# Patient Record
Sex: Male | Born: 1980 | Hispanic: Yes | Marital: Married | Smoking: Light tobacco smoker
Health system: Southern US, Community
[De-identification: ages and names within clinical notes are randomized; demographics above are authoritative.]

## PROBLEM LIST (undated history)

## (undated) DIAGNOSIS — I10 Essential (primary) hypertension: Secondary | ICD-10-CM

## (undated) DIAGNOSIS — F419 Anxiety disorder, unspecified: Secondary | ICD-10-CM

## (undated) HISTORY — PX: FRACTURE SURGERY: SHX138

---

## 2007-10-14 ENCOUNTER — Emergency Department: Payer: Self-pay | Admitting: Emergency Medicine

## 2007-10-14 ENCOUNTER — Other Ambulatory Visit: Payer: Self-pay

## 2009-07-09 IMAGING — US ABDOMEN ULTRASOUND
1 series · 17 of 25 positions shown · non-contrast
Comparison: none

REASON FOR EXAM: RUQ pain with fatty foods
COMMENTS:

[Series 1: abdomen ultrasound · 17 of 47 slices shown]
[im 1/47]
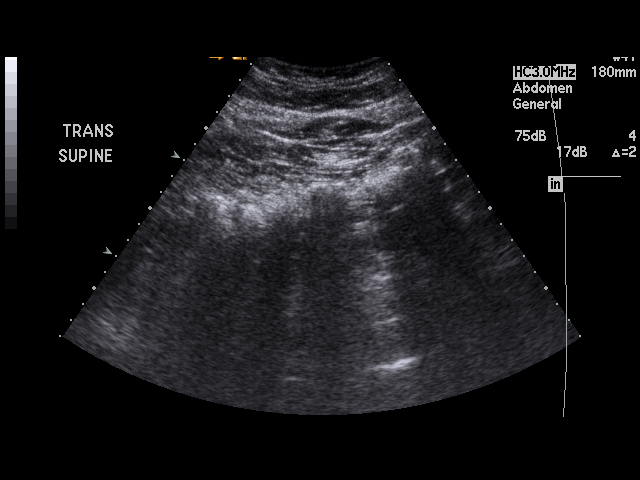
[im 4/47]
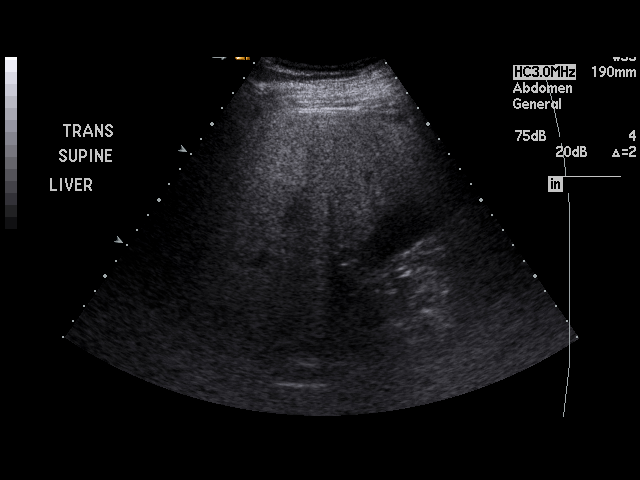
[im 6/47]
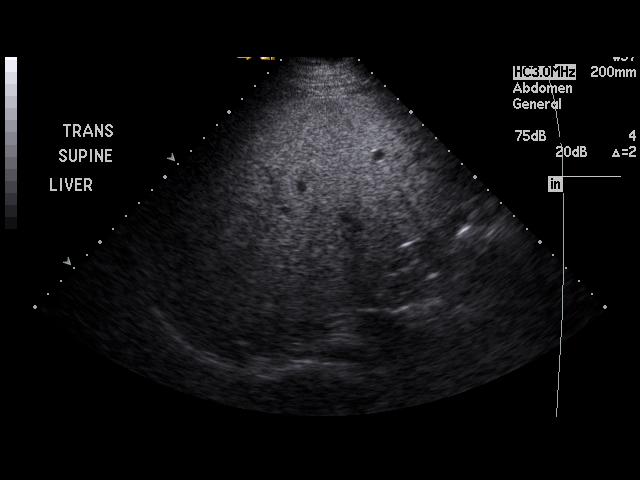
[im 10/47]
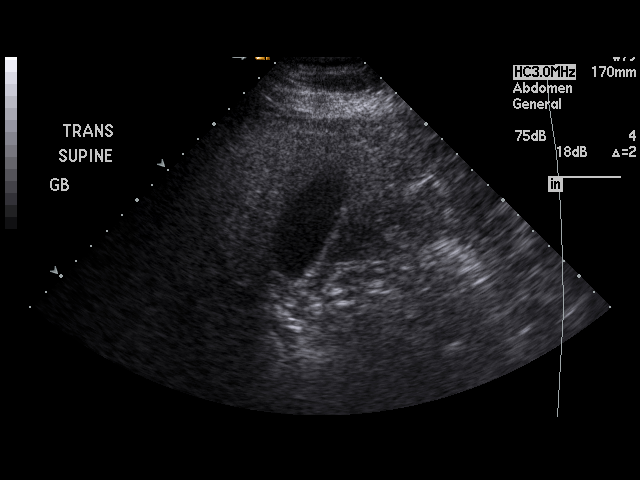
[im 12/47]
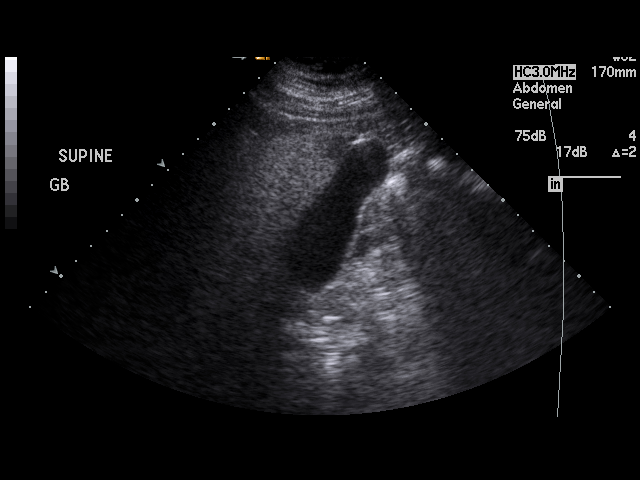
[im 16/47]
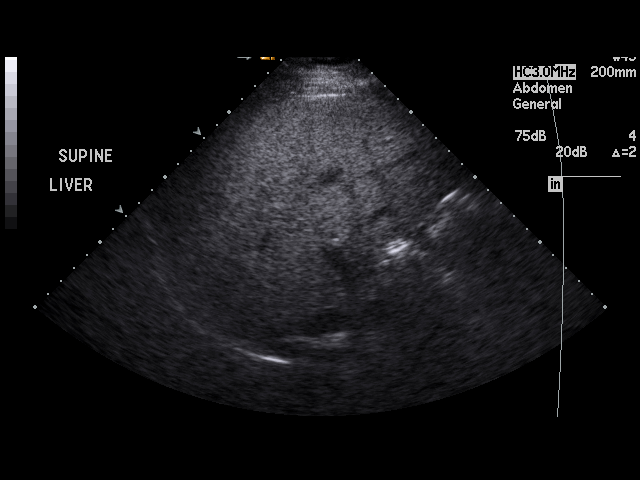
[im 18/47]
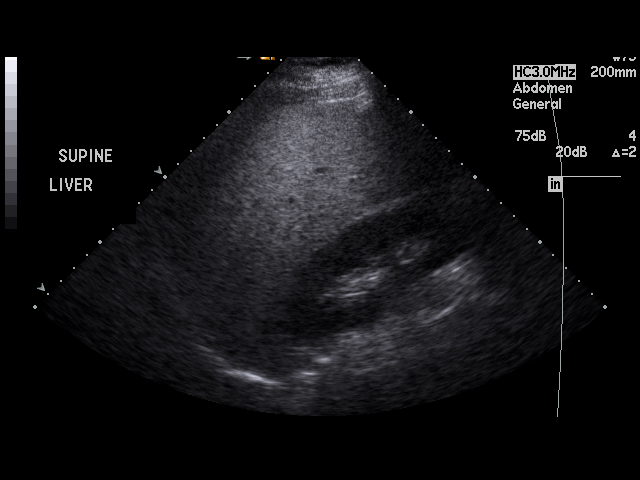
[im 22/47]
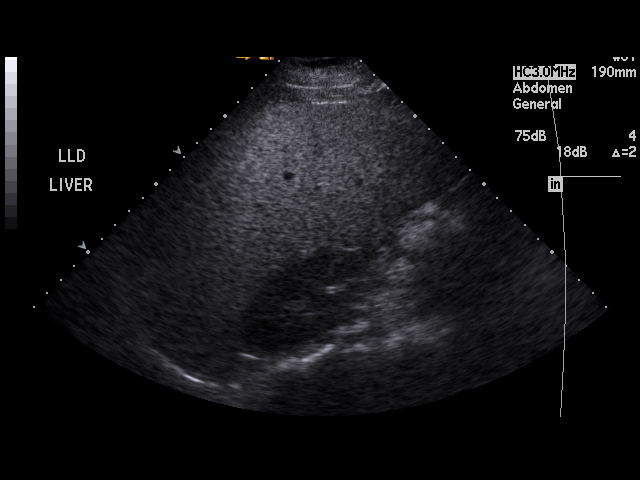
[im 24/47]
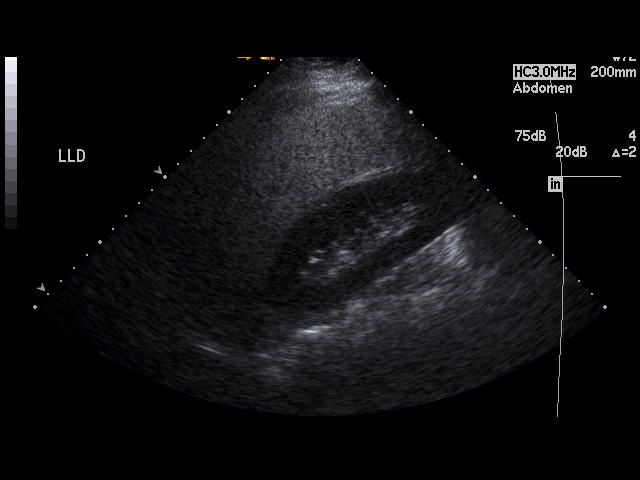
[im 25/47]
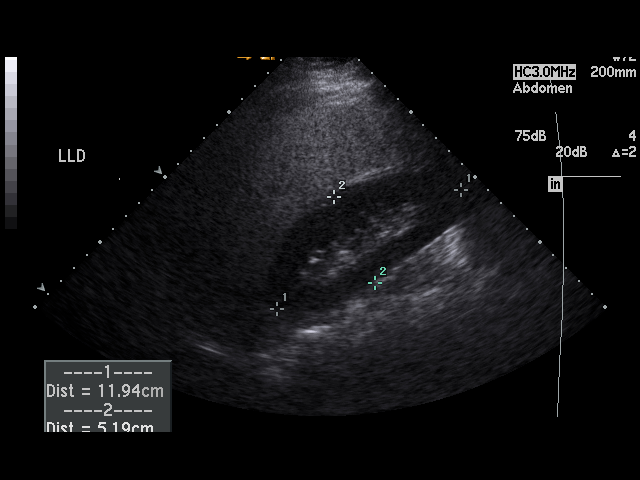
[im 29/47]
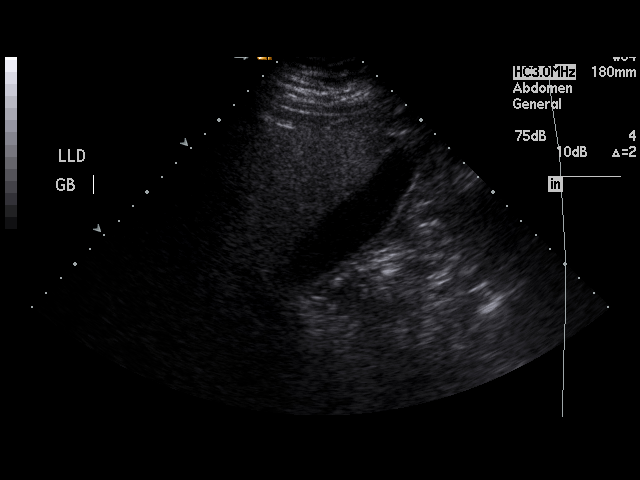
[im 31/47]
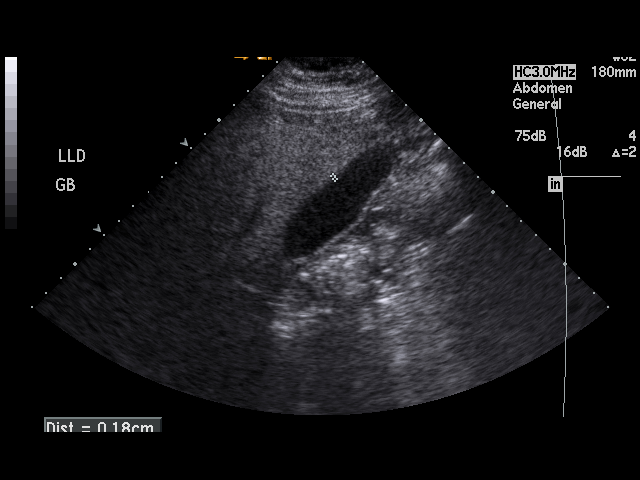
[im 35/47]
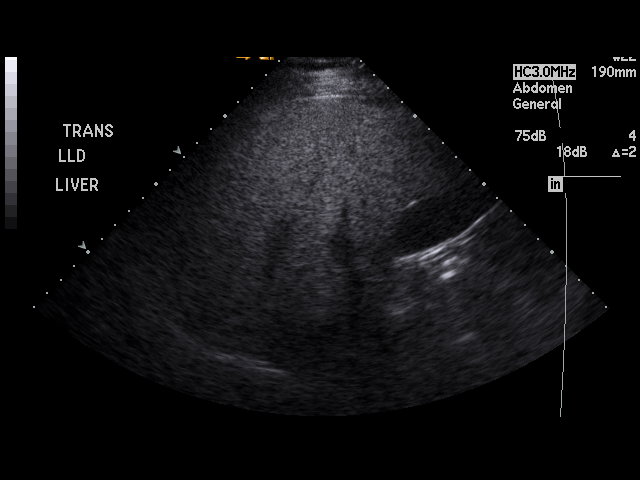
[im 37/47]
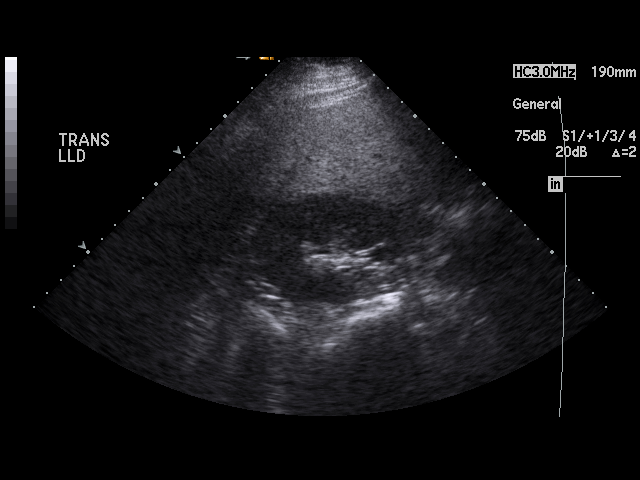
[im 41/47]
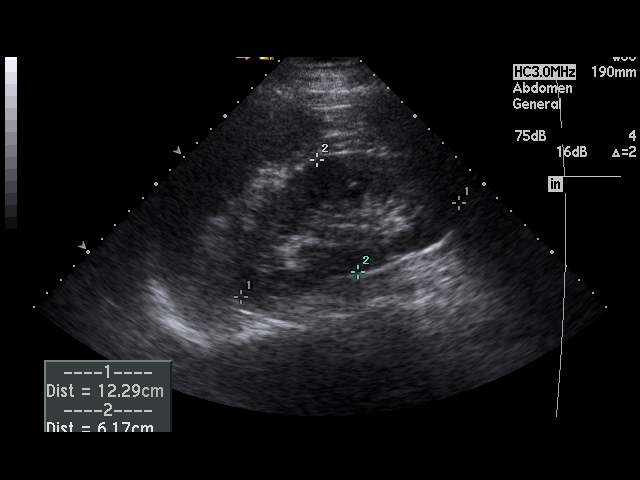
[im 43/47]
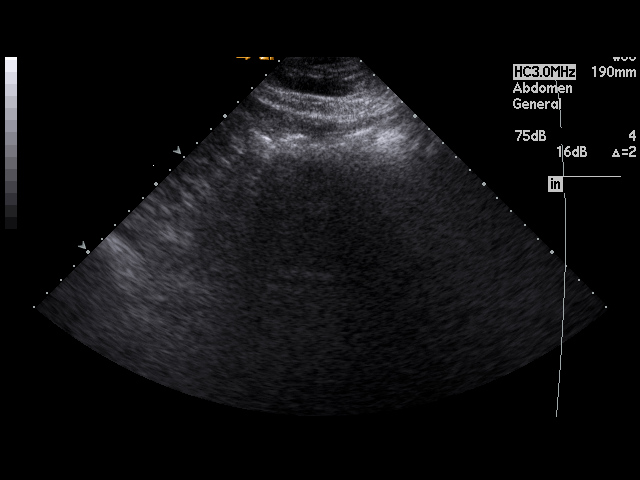
[im 47/47]
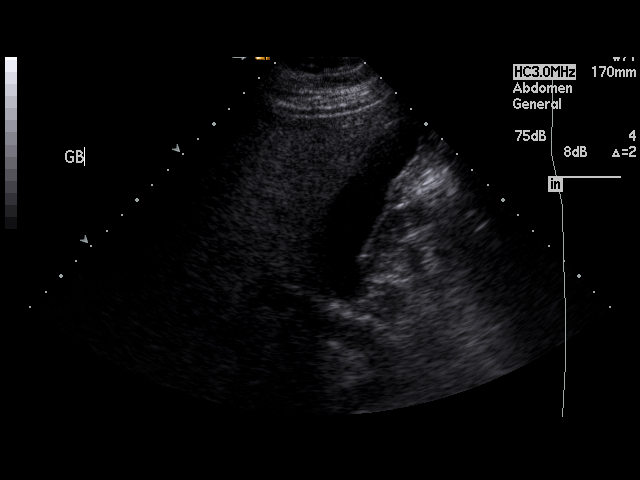

[17 of 25 positions shown; findings below may reference images not displayed]

PROCEDURE:     US  - US ABDOMEN GENERAL SURVEY  - October 14, 2007  [DATE]

RESULT:     The liver demonstrates a heterogeneous echotexture. Hepatopetal
flow is demonstrated within the portal vein. The visualized portions of the
inferior vena cava and aorta are unremarkable. Evaluation of the gallbladder
demonstrates no evidence of pericholecystic fluid, gallstones or sludging.
Gallbladder wall thickness is 1.8 mm. The common bile duct measures 3.3 mm
in diameter. The kidneys and spleen are unremarkable. The pancreas is not
visualized.
IMPRESSION: 1.     Hepatic steatosis without further focal or acute abnormalities.
2.     Dr. Zsizell of the Emergency Department was informed of these
findings via preliminary faxed report on 10/14/2007 at [DATE], [place]
Standard Time.

## 2015-01-23 ENCOUNTER — Emergency Department
Admission: EM | Admit: 2015-01-23 | Discharge: 2015-01-23 | Disposition: A | Discharge status: Home / Self Care | Payer: Self-pay | Attending: Emergency Medicine | Admitting: Emergency Medicine

## 2015-01-23 ENCOUNTER — Encounter: Payer: Self-pay | Admitting: Emergency Medicine

## 2015-01-23 DIAGNOSIS — Z720 Tobacco use: Secondary | ICD-10-CM | POA: Insufficient documentation

## 2015-01-23 DIAGNOSIS — K591 Functional diarrhea: Secondary | ICD-10-CM | POA: Insufficient documentation

## 2015-01-23 MED ORDER — DICYCLOMINE HCL 10 MG PO CAPS
10.0000 mg | ORAL_CAPSULE | Freq: Once | ORAL | Status: AC
  Administered 2015-01-23: 10 mg via ORAL
  Filled 2015-01-23: qty 1

## 2015-01-23 MED ORDER — DICYCLOMINE HCL 20 MG PO TABS: 20.0000 mg | ORAL_TABLET | Freq: Three times a day (TID) | ORAL | Status: DC | PRN

## 2015-01-23 MED ORDER — DIPHENOXYLATE-ATROPINE 2.5-0.025 MG PO TABS: 1.0000 | ORAL_TABLET | Freq: Four times a day (QID) | ORAL | Status: AC | PRN

## 2015-01-23 MED ORDER — DIPHENOXYLATE-ATROPINE 2.5-0.025 MG PO TABS
2.0000 | ORAL_TABLET | Freq: Once | ORAL | Status: AC
  Administered 2015-01-23: 2 via ORAL
  Filled 2015-01-23: qty 2

## 2015-01-23 NOTE — ED Notes (Signed)
Patient here complaining of diarrhea, began Sat. States he has been to the bathroom X 8 with increasing frequency.  Family members have similar symptoms with vomiting and diarrhea.  Wife gave him Immodium AD.

## 2015-01-23 NOTE — Discharge Instructions (Signed)
Opciones de alimentos para ayudar a Actuary - Adultos (Food Choices to Help Relieve Diarrhea, Adult) Cuando se tiene diarrea, los alimentos que se ingieren y los hbitos de alimentacin son Engineer, production. Elegir los [employer] y las bebidas adecuados ayuda a Actuary. Adems, debido a que la diarrea puede durar [employer], debe reponer la prdida de lquidos y Customer service manager (como sodio, potasio y Editor, commissioning) a fin de ayudar a Statistician.  QU PAUTAS GENERALES DEBO SEGUIR?  Beba lentamente 1 taza (8 onzas) de lquido por cada episodio de diarrea. Si bebe una cantidad de lquidos suficiente, la orina ser de tono claro o color amarillo plido.  Consuma alimentos con almidn. Algunas buenas opciones son arroz blanco, tostada blanca, pasta, cereales con bajo contenido de fibras, papas al horno (sin cscara), galletas saladas y panecillos.  Evite las porciones grandes de cualquier vegetal cocido.  Limite las frutas a dos porciones por da. Una porcin es  taza o un trozo pequeo.  Alimentos con menos de 2 g de fibra por porcin.  Limite las grasas a menos de 8 cucharaditas (38g) por Futures trader.  Evite las comidas fritas.  Consuma alimentos que contengan probiticos. Los probiticos se encuentran en ciertos productos lcteos.  Evite los alimentos y las bebidas que pueden aumentar la velocidad a la que el alimento se mueve a travs del estmago y de los intestinos (tracto gastrointestinal). Lo que debe evitar:  Alimentos ricos en fibra, como frutas secas, frutas y vegetales crudos, frutos secos, semillas, alimentos con cereales integrales.  Alimentos muy condimentados y con alto contenido de Neurosurgeon.  Alimentos y bebidas endulzados con jarabe de maz de alto contenido de fructosa, miel o alcoholes de International aid/development worker, como xilitol, sorbitol y manitol. QU ALIMENTOS SE RECOMIENDAN? Cereales Arroz blanco. Pan blanco, francs o pita (fresco o tostado), incluidos los  [place], los bollos y las rosquillas. Pastas blancas. Galletas de [place], [place] o [place]. Pretzels. Cereales con bajo contenido de [employer] cocidos en agua (como harina de maz, smola o crema de cereales). Muffins. Pan cimo Tostada Melba. [place]iscote.  Vegetales Papas (sin cscara). Jugo de tomates o de vegetales Vegetales bien cocidos o enlatados sin semillas. Deatra James tierna. Frutas Pur de [employer] cocido o enlatado, damascos, cerezas, cctel de frutas, pomelos, duraznos, peras o ciruelas. Bananas frescas, manzanas sin cscara, cerezas, uvas, meln, pomelo, duraznos, naranjas o ciruelas.  Carnes y otros productos con protenas Pollo al horno o hervido. Huevos. Tofu. Pescado. Mariscos. Mantequilla de man, sin trozos. Carne molida o un bife tierno bien cocido, jamn, ternera, cordero, cerdo o aves.  Lcteos Yogur natural, kefir y Dentist bebible sin Paediatric nurse. Leche sin Advice worker, suero de [place] o [place] de soja. Queso duro comn. Bebidas Bebidas deportivas. Caldos claros. Jugos de fruta diluidos (excepto de ciruelas). Gaseosas sin cafena comunes, como gaseosa de [place]. Agua. Ts descafeinados. Soluciones de rehidratacin oral. Bebidas sin azcar no endulzadas con alcoholes de azcar. Otros Consom, caldo o sopas hechas con los alimentos recomendados.  Los artculos mencionados arriba pueden no ser [employer]on de las bebidas o los alimentos recomendados. Comunquese con el nutricionista para conocer ms opciones. QU ALIMENTOS NO SE RECOMIENDAN? Cereales Cereales, galletas, pastas, panecillos y panes de cereales integrales, salvado o centeno. Arroz integral o arroz salvaje. Cereales con menos de 2 g de fibra por porcin. Tortillas de maz o tacos. Harina de avena cocida o seca. Granola. Palomitas de maz. Vegetales Vegetales crudos. Repollo, brcoli, repollitos de Brus[place], alcachofas, porotos, hojas de remolacha, maz,  col rizada, legumbres, guisantes y batatas. Cscara de papas.  [place] y repollo cocidos. Nils Pyle Frutas secas, incluidas las ciruelas y los dtiles. Frutas crudas. Compota o ciruelas secas. Manzanas frescas con cscara, damascos, mangos, peras, frambuesas y frutillas.  Carnes y otros productos con protenas Mantequilla de man espesa. Frutos secos y semillas. Porotos y lentejas. Panceta.  Lcteos Quesos con alto contenido de [place]. Leche, leche chocolatada y bebidas hechas con [place], como los batidos. Crema. Helados. Dulces y [employer], donas y pan dulce. Panqueques y waffles. Grasas y [employer] Noble. Salsas a base de crema. Margarina. Aceites para ensaladas. Condimentos para ensaladas. Aceitunas. Aguacates.  Bebidas Bebidas con cafena (como caf, t, refrescos o bebidas energizantes). Bebidas alcohlicas. Jugos de frutas con pulpa. Jugo de ciruelas. Bebidas endulzadas con jarabe de maz de alto contenido de fructosa o alcoholes de International aid/development worker. Otros Coco. Salsa picante. [place] en polvo. [place]. Salsas. Sopas a base de crema o de [place].  Los artculos mencionados arriba pueden no ser [employer]on de las bebidas y los alimentos que se Theatre stage manager. Comunquese con el nutricionista para recibir ms informacin. QU DEBO HACER SI ME DESHIDRATO? Algunas veces, la diarrea puede producir deshidratacin. Entre los signos de deshidratacin se incluyen la orina oscura y la boca y la piel secas. Si piensa que est deshidratado, debe rehidratarse con una solucin de rehidratacin oral. Estas soluciones se pueden comprar en las farmacias, en las tiendas minoristas o por Internet.  Beba  o 1 taza (120-25ml) de solucin de rehidratacin oral cada vez que tenga un episodio de diarrea. Si beber esta cantidad empeora la diarrea, intente beber en cantidades ms pequeas con ms frecuencia. Por ejemplo, tomar 1-3 cucharaditas (5-53ml) cada 5-44minutos.  Una regla general para mantenerse hidratado es beber 1  -2 litros de lquido Air cabin crew. Hable  con el mdico sobre la cantidad especfica que usted debe beber diariamente. Beba suficiente lquido para Photographer orina clara o de color amarillo plido.   Esta informacin no tiene Theme park manager el consejo del mdico. Asegrese de hacerle al mdico cualquier pregunta que tenga.   Document Released: 04/01/2005 Document Revised: 04/22/2014 Elsevier Interactive Patient Education 2016 [employer].  Diarrea  (Diarrhea) La diarrea es la materia fecal (heces) acuosas. Puede hacerlo sentir dbil, cansado, sediento, o con la boca seca (signos de deshidratacin). La materia fecal acuosa es signo de otro problema, generalmente una infeccin. Suele durar 2 o 3 das. Puede durar ms tiempo si es el signo de una enfermedad grave. Cudese segn lo que le indique su mdico.  CUIDADOS EN EL HOGAR   Beba 1 taza (8 onzas) de lquido cada vez que tenga una deposicin acuosa.  No beba los siguientes lquidos:  Los que contengan azcares simples (fructosa, glucosa, galactosa, [place], sacarosa, maltosa).  Bebidas deportivas.  Jugos de fruta.  Productos lcteos enteros.  Gaseosas.  Bebidas con cafena (caf, t, gaseosas) o alcohol.  Puede usar soluciones de rehidratacin oral si su mdico lo autoriza. Usted mismo puede preparar la solucin. Siga esta receta:   -  cucharadita de sal.   cucharadita de bicarbonato de soda.   de cucharadita de sal sustituta que contenga cloruro de potasio.  1  cucharada de azcar.  1 litros (34 oz) de agua.  Evite los siguientes alimentos:  Los que tienen gran contenido de [place], como frutas y verduras.  Frutas secas, semillas y panes y cereales integrales.   Las endulzadas con alcohol de azcar (xylitol, sorbitol, manitol).  Trate de  comer los siguientes alimentos:  Los que tienen almidn, como arroz, pan, pasta, cereales bajos en azcar, avena, smola de maz, papas al horno, galletas y panecillos.  Bananas.  Pur de [employer].  Consuma  alimentos ricos en probiticos, como yogur y productos lcteos fermentados.  Lvese bien las manos cada vez que tenga una deposicin acuosa.  Slo tome los medicamentos que le haya indicado su mdico.  Tome un bao caliente para ayudar a Engineer, manufacturing systems ardor o dolor que producen las deposiciones aguadas. SOLICITE AYUDA DE INMEDIATO SI:   No puede beber lquidos sin devolver vomitar.  Sigue vomitando.  Tiene sangre en la materia fecal, o es de color negro y de aspecto alquitranado.  No hay emisin de [place] durante 6 a 8 horas o elimina una pequea cantidad de [place].  Usted tiene dolor de estmago (abdominal) que empeora o se mantiene en el mismo lugar (localiza).  Se siente dbil, mareado, confundido o se desmaya.  Siente un dolor de cabeza muy intenso.  La materia fecal acuosa no mejora.  Tiene fiebre o sntomas que persisten durante ms de 2-3 das.  Tiene fiebre y los sntomas empeoran de manera sbita. ASEGRESE DE QUE:   Comprende estas instrucciones.  Controlar su enfermedad.  Solicitar ayuda de inmediato si no mejora o si empeora.   Esta informacin no tiene Theme park manager el consejo del mdico. Asegrese de hacerle al mdico cualquier pregunta que tenga.   Document Released: 03/18/2012 Document Revised: 04/22/2014 Elsevier Interactive Patient Education [employer]

## 2015-01-23 NOTE — ED Provider Notes (Signed)
[facility] Emergency Department Provider Note  ____________________________________________  Time seen: Approximately 8:41 AM  I have reviewed the triage vital signs and the nursing notes.   HISTORY  Chief Complaint Diarrhea    HPI Frank Obrien is a 34 y.o. male patient complaining of diarrhea for 3 days. Patient has been to bathroom 8/10 in the last 24 hours. Patient states stools are watery. Patient family member has similar symptoms except they have been vomiting he has experienced vomiting. Patient said it was a transient relief with Imodium. Patient denies any bloody stools. Patient is no pain but stomach cramping.    History reviewed. No pertinent past medical history.  There are no active problems to display for this patient.   Past Surgical History  Procedure Laterality Date  . Fracture surgery      Current Outpatient Rx  Name  Route  Sig  Dispense  Refill  . dicyclomine (BENTYL) 20 MG tablet   Oral   Take 1 tablet (20 mg total) by mouth 3 (three) times daily as needed for spasms.   30 tablet   0   . diphenoxylate-atropine (LOMOTIL) 2.5-0.025 MG tablet   Oral   Take 1 tablet by mouth 4 (four) times daily as needed for diarrhea or loose stools.   30 tablet   1     Allergies Review of patient's allergies indicates no known allergies.  No family history on file.  Social History Social History  Substance Use Topics  . Smoking status: Light Tobacco Smoker  . Smokeless tobacco: None  . Alcohol Use: Yes     Comment: 1-2 a week    Review of Systems Constitutional: No fever/chills Eyes: No visual changes. ENT: No sore throat. Cardiovascular: Denies chest pain. Respiratory: Denies shortness of breath. Gastrointestinal: No abdominal pain.  No nausea, no vomiting. Diarrhea.  Genitourinary: Negative for dysuria. Musculoskeletal: Negative for back pain. Skin: Negative for rash. Neurological: Negative for headaches,  focal weakness or numbness. 10-point ROS otherwise negative.  ____________________________________________   PHYSICAL EXAM:  VITAL SIGNS: ED Triage Vitals  Enc Vitals Group     BP 01/23/15 0757 134/84 mmHg     Pulse Rate 01/23/15 0757 89     Resp --      Temp 01/23/15 0757 97.6 F (36.4 C)     Temp Source 01/23/15 0757 Oral     SpO2 01/23/15 0757 100 %     Weight 01/23/15 0757 215 lb (97.523 kg)     Height 01/23/15 0757  (1.727 m)     Head Cir --      Peak Flow --      Pain Score --      Pain Loc --      Pain Edu? --      Excl. in GC? --     Constitutional: Alert and oriented. Well appearing and in no acute distress. Eyes: Conjunctivae are normal. PERRL. EOMI. Head: Atraumatic. Nose: No congestion/rhinnorhea. Mouth/Throat: Mucous membranes are moist.  Oropharynx non-erythematous. Neck: No stridor.  No cervical spine tenderness to palpation. Hematological/Lymphatic/Immunilogical: No cervical lymphadenopathy. Cardiovascular: Normal rate, regular rhythm. Grossly normal heart sounds.  Good peripheral circulation. Respiratory: Normal respiratory effort.  No retractions. Lungs CTAB. Gastrointestinal: Soft and nontender. No distention. No abdominal bruits. Hyperactive bowel sounds. Musculoskeletal: No lower extremity tenderness nor edema.  No joint effusions. Neurologic:  Normal speech and language. No gross focal neurologic deficits are appreciated. No gait instability. Skin:  Skin is warm, dry  and intact. No rash noted. Psychiatric: Mood and affect are normal. Speech and behavior are normal.  ____________________________________________   LABS (all labs ordered are listed, but only abnormal results are displayed)  Labs Reviewed - No data to display ____________________________________________  EKG   ____________________________________________  RADIOLOGY   ____________________________________________   PROCEDURES  Procedure(s) performed:  None  Critical Care performed: No  ____________________________________________   INITIAL IMPRESSION / ASSESSMENT AND PLAN / ED COURSE  Pertinent labs & imaging results that were available during my care of the patient were reviewed by me and considered in my medical decision making (see chart for details).  Diarrhea. Patient given prescription for Lomotil and Bentyl. Patient advised follow-up with GI clinic if condition persists. ____________________________________________   FINAL CLINICAL IMPRESSION(S) / ED DIAGNOSES  Final diagnoses:  Functional diarrhea      Joni Reining, PA-C 01/23/15 1610  Myrna Blazer, MD 01/23/15 1556

## 2016-10-19 ENCOUNTER — Emergency Department: Payer: Self-pay

## 2016-10-19 ENCOUNTER — Emergency Department
Admission: EM | Admit: 2016-10-19 | Discharge: 2016-10-19 | Disposition: A | Discharge status: Home / Self Care | Payer: Self-pay | Attending: Emergency Medicine | Admitting: Emergency Medicine

## 2016-10-19 DIAGNOSIS — I10 Essential (primary) hypertension: Secondary | ICD-10-CM | POA: Insufficient documentation

## 2016-10-19 DIAGNOSIS — R002 Palpitations: Secondary | ICD-10-CM | POA: Insufficient documentation

## 2016-10-19 DIAGNOSIS — F17200 Nicotine dependence, unspecified, uncomplicated: Secondary | ICD-10-CM | POA: Insufficient documentation

## 2016-10-19 HISTORY — DX: Essential (primary) hypertension: I10

## 2016-10-19 LAB — BASIC METABOLIC PANEL
Anion gap: 10 (ref 5–15)
BUN: 13 mg/dL (ref 6–20)
CHLORIDE: 106 mmol/L (ref 101–111)
CO2: 23 mmol/L (ref 22–32)
CREATININE: 0.58 mg/dL — AB (ref 0.61–1.24)
Calcium: 9.5 mg/dL (ref 8.9–10.3)
GFR calc Af Amer: >60 mL/min (ref >60)
GFR calc non Af Amer: >60 mL/min (ref >60)
GLUCOSE: 114 mg/dL — AB (ref 65–99)
POTASSIUM: 3.1 mmol/L — AB (ref 3.5–5.1)
SODIUM: 139 mmol/L (ref 135–145)

## 2016-10-19 LAB — CBC
HEMATOCRIT: 47.5 % (ref 40.0–52.0)
Hemoglobin: 16.4 g/dL (ref 13.0–18.0)
MCH: 31.7 pg (ref 26.0–34.0)
MCHC: 34.4 g/dL (ref 32.0–36.0)
MCV: 92.1 fL (ref 80.0–100.0)
PLATELETS: 271 10*3/uL (ref 150–440)
RBC: 5.16 MIL/uL (ref 4.40–5.90)
RDW: 13.6 % (ref 11.5–14.5)
WBC: 10.8 10*3/uL — ABNORMAL HIGH (ref 3.8–10.6)

## 2016-10-19 LAB — TROPONIN I: Troponin I: <0.03 ng/mL (ref <0.03)

## 2016-10-19 MED ORDER — LORAZEPAM 0.5 MG PO TABS
0.5000 mg | ORAL_TABLET | Freq: Once | ORAL | Status: AC
  Administered 2016-10-19: 0.5 mg via ORAL
  Filled 2016-10-19: qty 1

## 2016-10-19 NOTE — ED Notes (Signed)
EKG showed and signed off by [facility].

## 2016-10-19 NOTE — ED Provider Notes (Signed)
[facility] Emergency Department Provider Note  Time seen: 9:43 PM  I have reviewed the triage vital signs and the nursing notes.   HISTORY  Chief Complaint Shortness of Breath and Anxiety    HPI Frank MeuseLeobardo Tawni Obrien Rubio is a 36 y.o. male with a past medical history of hypertension who presents to the emergency department with palpitations. According to the patient he was having some back pain earlier so he took over-the-counter back pain medication which contains acetaminophen and 65 mg of caffeine per tablet. He states he take 2 tablets earlier and then 6 hours later this evening took 2 additional tablets. He states worsening hour after taking the second dose of medication he was feeling very anxious and jittery. He was having palpitations in which his heart felt like it was racing. Denies any chest pain or trouble breathing at any point. Patient states several years ago he took caffeine pills and had a similar episode. He was unaware that this medication contains caffeine. Patient states he rarely drinks caffeine. Patient states he is feeling somewhat better but continues to feel very anxious and jittery. Heart rate currently 105 bpm during my examination.  Past Medical History:  Diagnosis Date  . Hypertension     There are no active problems to display for this patient.   Past Surgical History:  Procedure Laterality Date  . FRACTURE SURGERY      Prior to Admission medications   Medication Sig Start Date End Date Taking? Authorizing Provider  dicyclomine (BENTYL) 20 MG tablet Take 1 tablet (20 mg total) by mouth 3 (three) times daily as needed for spasms. 01/23/15 01/23/16  Joni ReiningSmith, Ronald K, PA-C    No Known Allergies  History reviewed. No pertinent family history.  Social History Social History  Substance Use Topics  . Smoking status: Light Tobacco Smoker  . Smokeless tobacco: Never Used  . Alcohol use Yes     Comment: 1-2 a week    Review of  Systems Constitutional: Negative for fever. Cardiovascular: Negative for chest pain.Positive for palpitations. Respiratory: Negative for shortness of breath. Gastrointestinal: Negative for abdominal pain Neurological: Negative for headache All other ROS negative  ____________________________________________   PHYSICAL EXAM:  VITAL SIGNS: ED Triage Vitals  Enc Vitals Group     BP 10/19/16 2008 (!) 188/101     Pulse Rate 10/19/16 2008 (!) 116     Resp 10/19/16 2008 14     Temp 10/19/16 2008 99.5 F (37.5 C)     Temp Source 10/19/16 2008 Oral     SpO2 10/19/16 2008 98 %     Weight 10/19/16 2016 230 lb (104.3 kg)     Height 10/19/16 2016 5\' 8"  (1.727 m)     Head Circumference --      Peak Flow --      Pain Score --      Pain Loc --      Pain Edu? --      Excl. in GC? --     Constitutional: Alert and oriented. Well appearing and in no distress. Eyes: Normal exam ENT   Head: Normocephalic and atraumatic.   Mouth/Throat: Mucous membranes are moist. Cardiovascular: Normal rate, regular rhythm. No murmur Respiratory: Normal respiratory effort without tachypnea nor retractions. Breath sounds are clear Gastrointestinal: Soft and nontender. No distention.  Musculoskeletal: Nontender with normal range of motion in all extremities. No lower extremity tenderness or edema. Neurologic:  Normal speech and language. No gross focal neurologic deficits  Skin:  Skin is warm, dry and intact.  Psychiatric: Mood and affect are normal.   ____________________________________________    EKG  EKG reviewed and interpreted by myself shows sinus tachycardia at 109 bpm, narrow QRS, normal axis, normal intervals, not concerning ST changes.  ____________________________________________    RADIOLOGY  Chest x-ray negative  ____________________________________________   INITIAL IMPRESSION / ASSESSMENT AND PLAN / ED COURSE  Pertinent labs & imaging results that were available during  my care of the patient were reviewed by me and considered in my medical decision making (see chart for details).  The patient presents to the emergency department for palpitations. Patient took 65 mg of caffeine per tablet to 2 tablets twice equally 260 mg of caffeine today. This is a significant amount of stimulant. I discussed with the patient is likely the cause of his palpitations. The rest of his workup including enzymes are normal. EKG is reassuring, mild tachycardia but this is likely due to to caffeine use. Given the patient's anxiousness in the emergency department we will dose 0.5 mg of Ativan. I discussed drinking plenty of non-caffeinated fluids and obtaining plenty of rest. Patient agreeable.  ____________________________________________   FINAL CLINICAL IMPRESSION(S) / ED DIAGNOSES  palpitations    Minna Antis, MD 10/19/16 2147

## 2016-10-19 NOTE — ED Triage Notes (Signed)
Pt presents via POV s/o SOB with ambulation and palpitations. Reports taking OTC medication for chronic back pain containing caffeine, now c/o palpitations and some SOB with ambulation. Denies chest pain.

## 2017-01-16 ENCOUNTER — Encounter: Payer: Self-pay | Admitting: Emergency Medicine

## 2017-01-16 DIAGNOSIS — I10 Essential (primary) hypertension: Secondary | ICD-10-CM | POA: Insufficient documentation

## 2017-01-16 DIAGNOSIS — F17210 Nicotine dependence, cigarettes, uncomplicated: Secondary | ICD-10-CM | POA: Insufficient documentation

## 2017-01-16 DIAGNOSIS — L0889 Other specified local infections of the skin and subcutaneous tissue: Secondary | ICD-10-CM | POA: Insufficient documentation

## 2017-01-16 NOTE — ED Triage Notes (Signed)
Pt c/o right pinky finger pain x1 week. Per pt, he had blister to the finger last Friday and since the area has developed swelling, pain and hot to the touch. Pt denies drainage.

## 2017-01-17 ENCOUNTER — Emergency Department
Admission: EM | Admit: 2017-01-17 | Discharge: 2017-01-17 | Disposition: A | Discharge status: Home / Self Care | Payer: Self-pay | Attending: Emergency Medicine | Admitting: Emergency Medicine

## 2017-01-17 DIAGNOSIS — L089 Local infection of the skin and subcutaneous tissue, unspecified: Secondary | ICD-10-CM

## 2017-01-17 MED ORDER — CEPHALEXIN 500 MG PO CAPS: 500.0000 mg | ORAL_CAPSULE | Freq: Three times a day (TID) | ORAL | 0 refills | Status: DC

## 2017-01-17 MED ORDER — SULFAMETHOXAZOLE-TRIMETHOPRIM 800-160 MG PO TABS: 2.0000 | ORAL_TABLET | Freq: Two times a day (BID) | ORAL | 0 refills | Status: DC

## 2017-01-17 MED ORDER — CEPHALEXIN 500 MG PO CAPS
500.0000 mg | ORAL_CAPSULE | Freq: Once | ORAL | Status: AC
  Administered 2017-01-17: 500 mg via ORAL
  Filled 2017-01-17: qty 1

## 2017-01-17 MED ORDER — SULFAMETHOXAZOLE-TRIMETHOPRIM 800-160 MG PO TABS
2.0000 | ORAL_TABLET | Freq: Once | ORAL | Status: AC
  Administered 2017-01-17: 2 via ORAL
  Filled 2017-01-17: qty 2

## 2017-01-17 MED ORDER — TETANUS-DIPHTH-ACELL PERTUSSIS 5-2.5-18.5 LF-MCG/0.5 IM SUSP
0.5000 mL | Freq: Once | INTRAMUSCULAR | Status: AC
  Administered 2017-01-17: 0.5 mL via INTRAMUSCULAR
  Filled 2017-01-17: qty 0.5

## 2017-01-17 NOTE — ED Provider Notes (Signed)
[facility] Emergency Department Provider Note  ____________________________________________   First MD Initiated Contact with Patient 01/17/17 0222     (approximate)  I have reviewed the triage vital signs and the nursing notes.   HISTORY  Chief Complaint Hand Pain    HPI Frank Obrien is a 36 y.o. male with no contributory medical history who presents for evaluation of pain and swelling in his right little finger.  He reports that originally he injured it about a week ago at work because of repetitively having to pull heavy objects with a rope.  It resulted in blisters to the proximal phalanx, palmar side, of both little fingers.  The left has scabbed over calloused over, but the right one became painful and increasingly swollen and starting about 24 hours ago.  He says that it hurts to flex and extend the finger and there is some redness and swelling to the part of the finger closest to the hand.  It does not radiate up into his hand and he has no pain in the palm or the back of his hand.  It does not radiate down past the first knuckle of his little finger.  He is still able to flex and extend even though there is some pain.  There is no drainage from the central area that had the blister originally.  He denies fever/chills, nausea, vomiting, numbness, tingling.   Past Medical History:  Diagnosis Date  . Hypertension     There are no active problems to display for this patient.   Past Surgical History:  Procedure Laterality Date  . FRACTURE SURGERY      Prior to Admission medications   Medication Sig Start Date End Date Taking? Authorizing Provider  cephALEXin (KEFLEX) 500 MG capsule Take 1 capsule (500 mg total) by mouth 3 (three) times daily. 01/17/17   Loleta Rose, MD  dicyclomine (BENTYL) 20 MG tablet Take 1 tablet (20 mg total) by mouth 3 (three) times daily as needed for spasms. 01/23/15 01/23/16  Joni Reining, PA-C    sulfamethoxazole-trimethoprim (BACTRIM DS,SEPTRA DS) 800-160 MG tablet Take 2 tablets by mouth 2 (two) times daily. 01/17/17   Loleta Rose, MD    Allergies Patient has no known allergies.  History reviewed. No pertinent family history.  Social History Social History  Substance Use Topics  . Smoking status: Light Tobacco Smoker  . Smokeless tobacco: Never Used  . Alcohol use Yes     Comment: 1-2 a week    Review of Systems Constitutional: No fever/chills Respiratory: Denies shortness of breath. Gastrointestinal: No abdominal pain.  No nausea, no vomiting.  No diarrhea.  No constipation. Musculoskeletal: Pain and swelling in proximal right little finger after injury last week. Negative for neck pain.  Negative for back pain. Integumentary: Negative for rash.  Healing blister on bilateral proximal phalanx of both little fingers. Neurological: Negative for focal weakness or numbness.   ____________________________________________   PHYSICAL EXAM:  VITAL SIGNS: ED Triage Vitals  Enc Vitals Group     BP 01/16/17 2321 (!) 165/91     Pulse Rate 01/16/17 2321 95     Resp 01/16/17 2321 16     Temp 01/16/17 2321 97.8 F (36.6 C)     Temp Source 01/16/17 2321 Oral     SpO2 01/16/17 2321 100 %     Weight 01/16/17 2319 104.3 kg (230 lb)     Height --      Head Circumference --  Peak Flow --      Pain Score --      Pain Loc --      Pain Edu? --      Excl. in GC? --     Constitutional: Alert and oriented. Well appearing and in no acute distress. Cardiovascular: Normal rate, regular rhythm. Good peripheral circulation.  Respiratory: Normal respiratory effort.  No retractions.  Musculoskeletal: the patient has swelling that is non-circumferential but notable on both sides and the palm of his right little finger in the proximal phalanx.  It does not radiate distally to the other phalanges and does not radiate up into the hand.  He has no pain, tenderness, nor swelling  proximal to the fifth MCP.  he does have some fusiform swelling and holds the finger in slight flexion, but he holds all of his fingers in slight flexion and has no tenderness along the flexor tendon sheath and no pain with passive extension of the affected digit.  There some erythema on the sides and the finger is tense, but there is no fluctuance or induration and no obvious fluid collection amenable to incision and drainage. Neurologic:  Normal speech and language. No gross focal neurologic deficits are appreciated.  Psychiatric: Mood and affect are normal. Speech and behavior are normal.  ____________________________________________   LABS (all labs ordered are listed, but only abnormal results are displayed)  Labs Reviewed - No data to display ____________________________________________  EKG  None - EKG not ordered by ED physician ____________________________________________  RADIOLOGY   No results found.  ____________________________________________   PROCEDURES  Critical Care performed: No   Procedure(s) performed:   Procedures   ____________________________________________   INITIAL IMPRESSION / ASSESSMENT AND PLAN / ED COURSE   my differential diagnosis must include deep infection such as flexor tenosynovitis, but his presentation is not clinically consistent with tenosynovitis as he does not have any pain along the tendon sheath and no pain with passive extension of the digit.  I believe he has a focal cellulitis of the proximal phalanx of the fifth finger due to having an open wound that most likely was contaminated with skin flora.  There is no fluid collection amenable to drainage and I think that I&D would only further introduce more bacteria.  I will start him on both Keflex and Bactrim DS for broad coverage of staph and strep species including MRSA.  I have given him a Tdap because he does not know when he last had a tetanus vaccination. I strongly encouraged  him and his wife to follow-up with the hand specialist at the orthopedics clinic and I gave follow-up information.  I gave my usual and customary return precautions.  They understand and agree with the plan.     ____________________________________________  FINAL CLINICAL IMPRESSION(S) / ED DIAGNOSES  Final diagnoses:  Finger infection     MEDICATIONS GIVEN DURING THIS VISIT:  Medications  sulfamethoxazole-trimethoprim (BACTRIM DS,SEPTRA DS) 800-160 MG per tablet 2 tablet (2 tablets Oral Given 01/17/17 0245)  cephALEXin (KEFLEX) capsule 500 mg (500 mg Oral Given 01/17/17 0245)  Tdap (BOOSTRIX) injection 0.5 mL (0.5 mLs Intramuscular Given 01/17/17 0245)     NEW OUTPATIENT MEDICATIONS STARTED DURING THIS VISIT:  New Prescriptions   CEPHALEXIN (KEFLEX) 500 MG CAPSULE    Take 1 capsule (500 mg total) by mouth 3 (three) times daily.   SULFAMETHOXAZOLE-TRIMETHOPRIM (BACTRIM DS,SEPTRA DS) 800-160 MG TABLET    Take 2 tablets by mouth 2 (two) times daily.    Modified  Medications   No medications on file    Discontinued Medications   No medications on file     Note:  This document was prepared using Dragon voice recognition software and may include unintentional dictation errors.    Loleta Rose, MD 01/17/17 [phone]

## 2017-01-17 NOTE — Discharge Instructions (Signed)
we believe that you have an infection in the tissue of your right little finger.  Please take the full course of both antibiotics prescribed.  Both of them should be available on the $4 list at [facility].  we recommend that you call the number for the hand specialist listed so that you can schedule a follow-up appointment at the next available opportunity.    Return to the emergency department if you develop new or worsening symptoms that concern you.

## 2017-01-19 ENCOUNTER — Encounter: Payer: Self-pay | Admitting: Emergency Medicine

## 2017-01-19 ENCOUNTER — Emergency Department
Admission: EM | Admit: 2017-01-19 | Discharge: 2017-01-19 | Disposition: A | Discharge status: Home / Self Care | Payer: Self-pay | Attending: Emergency Medicine | Admitting: Emergency Medicine

## 2017-01-19 DIAGNOSIS — I10 Essential (primary) hypertension: Secondary | ICD-10-CM | POA: Insufficient documentation

## 2017-01-19 DIAGNOSIS — F17200 Nicotine dependence, unspecified, uncomplicated: Secondary | ICD-10-CM | POA: Insufficient documentation

## 2017-01-19 DIAGNOSIS — L089 Local infection of the skin and subcutaneous tissue, unspecified: Secondary | ICD-10-CM | POA: Insufficient documentation

## 2017-01-19 MED ORDER — TRAMADOL HCL 50 MG PO TABS: 50.0000 mg | ORAL_TABLET | Freq: Four times a day (QID) | ORAL | 0 refills | Status: DC | PRN

## 2017-01-19 NOTE — ED Triage Notes (Signed)
Pt comes into the ED via POV c/o infection of the right pinky.  Patient was seen here Thursday for the same issue and prescribed two antibiotics and naproxen.  Patient states that the pain has not gotten any better.  He has not completed the antibiotics and has not followed up with the hand specialist as recommended per MD.  Patient explains that pain is worse at night and he has no more pain medication.  Patient in NAD at this time.  Swelling and redness present in the right pinky, no drainage present at this time.

## 2017-01-19 NOTE — Discharge Instructions (Signed)
Please continue taking the antibiotics. Call Dr. Bryson Ha office tomorrow.

## 2017-01-30 NOTE — ED Provider Notes (Signed)
[facility] Emergency Department Provider Note   ____________________________________________    I have reviewed the triage vital signs and the nursing notes.   HISTORY  Chief Complaint Cellulitis     HPI Frank Obrien is a 36 y.o. male Who presents withpain and swelling in his right little finger. Patient seen recently in the emergency department and started on antibiotics, he reports he has been compliant with his medication. He has not seen hand surgery as recommend that however. He notes worsening swelling of the proximal portion of his finger. No fevers or chills. He notes that today he has been able to make it drain   Past Medical History:  Diagnosis Date  . Hypertension     There are no active problems to display for this patient.   Past Surgical History:  Procedure Laterality Date  . FRACTURE SURGERY      Prior to Admission medications   Medication Sig Start Date End Date Taking? Authorizing Provider  cephALEXin (KEFLEX) 500 MG capsule Take 1 capsule (500 mg total) by mouth 3 (three) times daily. 01/17/17   Loleta Rose, MD  dicyclomine (BENTYL) 20 MG tablet Take 1 tablet (20 mg total) by mouth 3 (three) times daily as needed for spasms. 01/23/15 01/23/16  Joni Reining, PA-C  sulfamethoxazole-trimethoprim (BACTRIM DS,SEPTRA DS) 800-160 MG tablet Take 2 tablets by mouth 2 (two) times daily. 01/17/17   Loleta Rose, MD  traMADol (ULTRAM) 50 MG tablet Take 1 tablet (50 mg total) by mouth every 6 (six) hours as needed. 01/19/17 01/19/18  Jene Every, MD     Allergies Patient has no known allergies.  No family history on file.  Social History Social History  Substance Use Topics  . Smoking status: Light Tobacco Smoker  . Smokeless tobacco: Never Used  . Alcohol use Yes     Comment: 1-2 a week    Review of Systems  Constitutional: No fever/chills     Gastrointestinal: No abdominal pain.  No nausea, no  vomiting.     Skin: as above     ____________________________________________   PHYSICAL EXAM:  VITAL SIGNS: ED Triage Vitals  Enc Vitals Group     BP 01/19/17 1751 (!) 161/90     Pulse Rate 01/19/17 1751 79     Resp 01/19/17 1751 18     Temp 01/19/17 1751 98.3 F (36.8 C)     Temp Source 01/19/17 1751 Oral     SpO2 01/19/17 1751 98 %     Weight 01/19/17 1749 104.3 kg (230 lb)     Height 01/19/17 1749 1.778 m (5\' 10" )     Head Circumference --      Peak Flow --      Pain Score 01/19/17 1748 1     Pain Loc --      Pain Edu? --      Excl. in GC? --     Constitutional: Alert and oriented. No acute distress. Pleasant and interactive Eyes: Conjunctivae are normal.   Nose: No congestion/rhinnorhea. Mouth/Throat: Mucous membranes are moist.   Cardiovascular: Normal rate, regular rhythm.  Respiratory: Normal respiratory effort.  No retractions.  Musculoskeletal: right little finger: Mild amount of swelling on the dorsal aspect proximally, suspect there was a small abscess there but the patient has pressed on it and purulent drainage has occurred, I am able to express a small amount of purulence as well. Distally the finger is normal, no sausage digits, patient is able  to extend without pain, not consistent with tenosynovitis Neurologic:  Normal speech and language. No gross focal neurologic deficits are appreciated.   Skin:  Skin is warm, dry and intact. No rash noted.   ____________________________________________   LABS (all labs ordered are listed, but only abnormal results are displayed)  Labs Reviewed - No data to display ____________________________________________  EKG   ____________________________________________  RADIOLOGY  None ____________________________________________   PROCEDURES  Procedure(s) performed: No    Critical Care performed: No ____________________________________________   INITIAL IMPRESSION / ASSESSMENT AND PLAN / ED  COURSE  Pertinent labs & imaging results that were available during my care of the patient were reviewed by me and considered in my medical decision making (see chart for details).  small abscess formation with drainage performed by patient, recommending continued antibiotics and close follow-up with hand surgery. verified that hand surgery was available in one day.   ____________________________________________   FINAL CLINICAL IMPRESSION(S) / ED DIAGNOSES  Final diagnoses:  Finger infection      NEW MEDICATIONS STARTED DURING THIS VISIT:  Discharge Medication List as of 01/19/2017  6:21 PM    START taking these medications   Details  traMADol (ULTRAM) 50 MG tablet Take 1 tablet (50 mg total) by mouth every 6 (six) hours as needed., Starting Sun 01/19/2017, Until Mon 01/19/2018, Print         Note:  This document was prepared using Dragon voice recognition software and may include unintentional dictation errors.    Jene EveryKinner, Juliet Vasbinder, MD 01/30/17 [id number]

## 2017-06-02 ENCOUNTER — Emergency Department: Payer: Self-pay

## 2017-06-02 ENCOUNTER — Emergency Department
Admission: EM | Admit: 2017-06-02 | Discharge: 2017-06-02 | Disposition: A | Discharge status: Home / Self Care | Payer: Self-pay | Attending: Emergency Medicine | Admitting: Emergency Medicine

## 2017-06-02 ENCOUNTER — Other Ambulatory Visit: Payer: Self-pay

## 2017-06-02 ENCOUNTER — Encounter: Payer: Self-pay | Admitting: Emergency Medicine

## 2017-06-02 DIAGNOSIS — I10 Essential (primary) hypertension: Secondary | ICD-10-CM | POA: Insufficient documentation

## 2017-06-02 DIAGNOSIS — F17200 Nicotine dependence, unspecified, uncomplicated: Secondary | ICD-10-CM | POA: Insufficient documentation

## 2017-06-02 DIAGNOSIS — R000 Tachycardia, unspecified: Secondary | ICD-10-CM | POA: Insufficient documentation

## 2017-06-02 DIAGNOSIS — R002 Palpitations: Secondary | ICD-10-CM

## 2017-06-02 LAB — CBC WITH DIFFERENTIAL/PLATELET
BASOS ABS: 0.1 10*3/uL (ref 0–0.1)
Basophils Relative: 1 %
EOS PCT: 1 %
Eosinophils Absolute: 0.1 10*3/uL (ref 0–0.7)
HEMATOCRIT: 47.4 % (ref 40.0–52.0)
Hemoglobin: 16.2 g/dL (ref 13.0–18.0)
LYMPHS ABS: 1.2 10*3/uL (ref 1.0–3.6)
Lymphocytes Relative: 11 %
MCH: 31.4 pg (ref 26.0–34.0)
MCHC: 34.1 g/dL (ref 32.0–36.0)
MCV: 92 fL (ref 80.0–100.0)
MONO ABS: 1.1 10*3/uL — AB (ref 0.2–1.0)
Monocytes Relative: 9 %
NEUTROS ABS: 9.2 10*3/uL — AB (ref 1.4–6.5)
Neutrophils Relative %: 78 %
PLATELETS: 281 10*3/uL (ref 150–440)
RBC: 5.15 MIL/uL (ref 4.40–5.90)
RDW: 14 % (ref 11.5–14.5)
WBC: 11.7 10*3/uL — AB (ref 3.8–10.6)

## 2017-06-02 LAB — URINE DRUG SCREEN, QUALITATIVE (ARMC ONLY)
AMPHETAMINES, UR SCREEN: NOT DETECTED
Barbiturates, Ur Screen: NOT DETECTED
Benzodiazepine, Ur Scrn: NOT DETECTED
CANNABINOID 50 NG, UR ~~LOC~~: NOT DETECTED
COCAINE METABOLITE, UR ~~LOC~~: NOT DETECTED
MDMA (ECSTASY) UR SCREEN: NOT DETECTED
Methadone Scn, Ur: NOT DETECTED
OPIATE, UR SCREEN: NOT DETECTED
PHENCYCLIDINE (PCP) UR S: NOT DETECTED
Tricyclic, Ur Screen: NOT DETECTED

## 2017-06-02 LAB — COMPREHENSIVE METABOLIC PANEL
ALBUMIN: 4.5 g/dL (ref 3.5–5.0)
ALK PHOS: 76 U/L (ref 38–126)
ALT: 117 U/L — AB (ref 17–63)
AST: 45 U/L — ABNORMAL HIGH (ref 15–41)
Anion gap: 8 (ref 5–15)
BILIRUBIN TOTAL: 0.4 mg/dL (ref 0.3–1.2)
BUN: 8 mg/dL (ref 6–20)
CALCIUM: 9.1 mg/dL (ref 8.9–10.3)
CO2: 22 mmol/L (ref 22–32)
CREATININE: 0.61 mg/dL (ref 0.61–1.24)
Chloride: 108 mmol/L (ref 101–111)
GFR calc Af Amer: >60 mL/min (ref >60)
GFR calc non Af Amer: >60 mL/min (ref >60)
GLUCOSE: 158 mg/dL — AB (ref 65–99)
Potassium: 3.3 mmol/L — ABNORMAL LOW (ref 3.5–5.1)
SODIUM: 138 mmol/L (ref 135–145)
TOTAL PROTEIN: 8 g/dL (ref 6.5–8.1)

## 2017-06-02 LAB — CK: Total CK: 258 U/L (ref 49–397)

## 2017-06-02 LAB — MAGNESIUM: Magnesium: 2 mg/dL (ref 1.7–2.4)

## 2017-06-02 LAB — LIPASE, BLOOD: Lipase: 23 U/L (ref 11–51)

## 2017-06-02 LAB — TSH: TSH: 0.547 u[IU]/mL (ref 0.350–4.500)

## 2017-06-02 LAB — T4, FREE: Free T4: 0.64 ng/dL (ref 0.61–1.12)

## 2017-06-02 LAB — FIBRIN DERIVATIVES D-DIMER (ARMC ONLY): Fibrin derivatives D-dimer (ARMC): 218.05 ng/mL (FEU) (ref 0.00–499.00)

## 2017-06-02 LAB — TROPONIN I: Troponin I: <0.03 ng/mL (ref <0.03)

## 2017-06-02 MED ORDER — SODIUM CHLORIDE 0.9 % IV BOLUS (SEPSIS)
1000.0000 mL | INTRAVENOUS | Status: AC
  Administered 2017-06-02: 1000 mL via INTRAVENOUS

## 2017-06-02 NOTE — ED Notes (Signed)
Pt up to void.

## 2017-06-02 NOTE — ED Notes (Signed)
Report from rebecca, rn.  

## 2017-06-02 NOTE — ED Notes (Signed)
ivf had not infused. Bag raised higher, ivf flowing at this time.

## 2017-06-02 NOTE — ED Notes (Signed)
 left in bag to infuse. Pt and spouse watching tv, pt states "I feel fine".

## 2017-06-02 NOTE — ED Triage Notes (Addendum)
Pt reports having the sensation of his heart beating fast starting around 8-9pm; pt says sensation has eased but when he puts his hand on his chest he can feel his heart beating fast; pt hypertensive in triage; says when he was 12-13 he had to take medication for HTN that MD attributed to weight; pt was only on the medication for several weeks and has not taken since; pt denies chest pain; denies feeling SOB; denies headache;

## 2017-06-02 NOTE — ED Provider Notes (Signed)
[facility] Emergency Department Provider Note  ____________________________________________   First MD Initiated Contact with Patient 06/02/17 0132     (approximate)  I have reviewed the triage vital signs and the nursing notes.   HISTORY  Chief Complaint Tachycardia and Hypertension    HPI Frank Obrien is a 37 y.o. male who reports a prior medical history of hyperlipidemia and hypertension as a child but who has not taken medications for at least 20 years.  He presents for evaluation of palpitations and a sensation of a strong heartbeat.  He states that his heart always beats a little bit faster than "normal people", but starting tonight he had acute onset of palpitations at around 9:00 PM.  It did not get better over time so that is why he came to the emergency department.  He says that it is not painful, but he can feel his heart beating strongly in his chest and that it is beating too rapidly.  He is worried about his blood pressure being high as a result.  Nothing makes the symptoms better nor worse and he describes it as severe.  He denies any recent viral symptoms, fever/chills, nasal congestion/runny nose, chest pain, shortness of breath, productive cough, nausea, vomiting, abdominal pain, and dysuria.  He denies alcohol and drug use.  He takes naproxen for some chronic back pain but denies any other recent medications.  He has had no blood clots in his legs nor his lungs and no recent operations or immobilizations.    Past Medical History:  Diagnosis Date  . Hypertension     There are no active problems to display for this patient.   Past Surgical History:  Procedure Laterality Date  . FRACTURE SURGERY      Prior to Admission medications   Medication Sig Start Date End Date Taking? Authorizing Provider  naproxen sodium (ALEVE) 220 MG tablet Take 220 mg by mouth 3 (three) times daily as needed.   Yes [provider]     Allergies Patient has no known allergies.  History reviewed. No pertinent family history.  Social History Social History   Tobacco Use  . Smoking status: Light Tobacco Smoker  . Smokeless tobacco: Never Used  Substance Use Topics  . Alcohol use: Yes    Comment: 1-2 a week  . Drug use: No    Review of Systems Constitutional: No fever/chills Eyes: No visual changes. ENT: No sore throat. Cardiovascular: Acute onset palpitations.  Denies chest pain. Respiratory: Denies shortness of breath. Gastrointestinal: No abdominal pain.  No nausea, no vomiting.  No diarrhea.  No constipation. Genitourinary: Negative for dysuria. Musculoskeletal: Negative for neck pain.  Negative for back pain. Integumentary: Negative for rash. Neurological: Negative for headaches, focal weakness or numbness.   ____________________________________________   PHYSICAL EXAM:  VITAL SIGNS: ED Triage Vitals [06/02/17 0109]  Enc Vitals Group     BP (!) 176/102     Pulse Rate (!) 144     Resp 20     Temp 99 F (37.2 C)     Temp Source Oral     SpO2 98 %     Weight 108.9 kg (240 lb)     Height 1.702 m (5\' 7" )     Head Circumference      Peak Flow      Pain Score      Pain Loc      Pain Edu?      Excl. in GC?  Constitutional: Alert and oriented. Well appearing and in no acute distress. Eyes: Conjunctivae are normal.  Head: Atraumatic. Nose: No congestion/rhinnorhea. Mouth/Throat: Mucous membranes are moist. Neck: No stridor.  No meningeal signs.   Cardiovascular: Tachycardia, regular rhythm. Good peripheral circulation. Grossly normal heart sounds. Respiratory: Normal respiratory effort.  No retractions. Lungs CTAB. Gastrointestinal: Soft and nontender. No distention.  Musculoskeletal: No lower extremity tenderness nor edema. No gross deformities of extremities. Neurologic:  Normal speech and language. No gross focal neurologic deficits are appreciated.  Skin:  Skin is warm, dry and  intact. No rash noted. Psychiatric: Mood and affect are normal. Speech and behavior are normal.  ____________________________________________   LABS (all labs ordered are listed, but only abnormal results are displayed)  Labs Reviewed  CBC WITH DIFFERENTIAL/PLATELET - Abnormal; Notable for the following components:      Result Value   WBC 11.7 (*)    Neutro Abs 9.2 (*)    Monocytes Absolute 1.1 (*)    All other components within normal limits  COMPREHENSIVE METABOLIC PANEL - Abnormal; Notable for the following components:   Potassium 3.3 (*)    Glucose, Bld 158 (*)    AST 45 (*)    ALT 117 (*)    All other components within normal limits  MAGNESIUM  TROPONIN I  LIPASE, BLOOD  CK  FIBRIN DERIVATIVES D-DIMER ([facility] ONLY)  URINE DRUG SCREEN, QUALITATIVE ([facility] ONLY)  TSH  T4, FREE   ____________________________________________  EKG  ED ECG REPORT I, Loleta Rose, the attending physician, personally viewed and interpreted this ECG.  Date: 06/02/2017 EKG Time: 1:08 AM Rate: 141 Rhythm: Sinus tachycardia QRS Axis: Left axis deviation Intervals: normal ST/T Wave abnormalities: normal Narrative Interpretation: no evidence of acute ischemia  ____________________________________________  RADIOLOGY   ED MD interpretation:  No acute process  Official radiology report(s): Dg Chest 2 View  Result Date: 06/02/2017 CLINICAL DATA:  Palpitations hypertension EXAM: CHEST  2 VIEW COMPARISON:  10/19/2016 FINDINGS: Mild bronchitic changes. No focal pulmonary infiltrate or effusion. Normal cardiomediastinal silhouette. No pneumothorax. IMPRESSION: No active cardiopulmonary disease.  Mild bronchitic changes. Electronically Signed   By: Jasmine Pang M.D.   On: 06/02/2017 02:44    ____________________________________________   PROCEDURES  Critical Care performed: No   Procedure(s) performed:   Procedures   ____________________________________________   INITIAL  IMPRESSION / ASSESSMENT AND PLAN / ED COURSE  As part of my medical decision making, I reviewed the following data within the electronic MEDICAL RECORD NUMBER Nursing notes reviewed and incorporated, Labs reviewed , EKG interpreted  and Notes from prior ED visits    Differential diagnosis includes, but is not limited to, volume depletion, medication or drug side effect (such as, but not limited to cocaine), acute infectious process, anemia, hyperthyroidism, pulmonary embolism, atrial flutter, SVT/AVNRT.   The patient's EKG appears to be sinus tachycardia.  It does not have a characteristic sawtooth pattern that would suggest an underlying atrial flutter.  I do appreciate P waves and it does not appear consistent with AVNRT.   He is a large man and I would be surprised if he suffers from hyperthyroidism but I will check TSH and free T4 in addition to a broad laboratory workup that also includes a d-dimer.  He is also slightly tachypneic but he is anxious about the palpitations and I think this is contributing.  He has no signs or symptoms of infectious disease but I will also check a chest x-ray to look for any obvious mass  or infiltrate.  I am checking a urine drug screen for any substances that could lead to tachycardia.  I have ordered a liter of fluids.  After his evaluation I will reassess.  I do not think that it would be wise or necessary to give a dose of adenosine for what does appear to be sinus tachycardia; inferior could reveal a different underlying rhythm such as atrial flutter but I believe that the risks outweigh the benefits.  Clinical Course as of Jun 02 556  Mon Jun 02, 2017  0255 Patient is having no viral URI symptoms to indicate the need to check an influenza panel, in spite of the currently community prevalence.  [CF]  [phone] I updated the patient about the reassuring lab results thus far and explained that only the thyroid function tests are remaining.  He says that again he feels  fine and has no discomfort other than just feeling the rapid heart rate.  His heart rate has improved steadily and is now down to the 110s, and due to an IV issue he has not gotten any of the fluids yet.  I will reassess after the liter bolus.  I do not think there is an indication for Ativan at this time.  [CF]  0555 The patient's heart rate has been down to around 100.  When I went into talk to him, his heart rate immediately jumped up to 120 again.  I think that anxiety is playing a component although he does seem to have sinus tachycardia for no obvious reason.  As mentioned previously, his lab work is all reassuring with no evidence of thyroid dysfunction, and normal d-dimer, no evidence of acute infection, etc.  After 1 L of IV fluids his heart rate did come down further but it had already come down to about 117 even prior to the fluids.  I provided reassurance to the patient and encouraged him to follow-up as an outpatient and I gave my usual customary return precautions.  There is no indication for any further workup at this time and I encouraged him to stay hydrated with water or other clear fluids and to follow-up.  He understands and agrees with the plan.  [CF]    Clinical Course User Index [CF] Loleta Rose, MD    ____________________________________________  FINAL CLINICAL IMPRESSION(S) / ED DIAGNOSES  Final diagnoses:  Sinus tachycardia  Palpitations     MEDICATIONS GIVEN DURING THIS VISIT:  Medications  sodium chloride 0.9 % bolus 1,000 mL (0 mLs Intravenous Stopped 06/02/17 0500)     ED Discharge Orders    None       Note:  This document was prepared using Dragon voice recognition software and may include unintentional dictation errors.    Loleta Rose, MD 06/02/17 [phone]

## 2017-06-02 NOTE — Discharge Instructions (Signed)
Your workup in the Emergency Department today was reassuring.  We did not find any specific abnormalities.  We recommend you drink plenty of fluids, take your regular medications and/or any new ones prescribed today, and follow up with the doctor(s) listed in these documents as recommended.  Return to the Emergency Department if you develop new or worsening symptoms that concern you.  

## 2017-06-24 ENCOUNTER — Emergency Department
Admission: EM | Admit: 2017-06-24 | Discharge: 2017-06-24 | Disposition: A | Discharge status: Home / Self Care | Payer: Self-pay | Attending: Emergency Medicine | Admitting: Emergency Medicine

## 2017-06-24 ENCOUNTER — Encounter: Payer: Self-pay | Admitting: Medical Oncology

## 2017-06-24 DIAGNOSIS — F419 Anxiety disorder, unspecified: Secondary | ICD-10-CM

## 2017-06-24 DIAGNOSIS — F17200 Nicotine dependence, unspecified, uncomplicated: Secondary | ICD-10-CM | POA: Insufficient documentation

## 2017-06-24 DIAGNOSIS — I10 Essential (primary) hypertension: Secondary | ICD-10-CM | POA: Insufficient documentation

## 2017-06-24 DIAGNOSIS — R002 Palpitations: Secondary | ICD-10-CM

## 2017-06-24 HISTORY — DX: Anxiety disorder, unspecified: F41.9

## 2017-06-24 LAB — CBC
HEMATOCRIT: 46.1 % (ref 40.0–52.0)
Hemoglobin: 15.7 g/dL (ref 13.0–18.0)
MCH: 31.6 pg (ref 26.0–34.0)
MCHC: 34 g/dL (ref 32.0–36.0)
MCV: 93 fL (ref 80.0–100.0)
PLATELETS: 281 10*3/uL (ref 150–440)
RBC: 4.96 MIL/uL (ref 4.40–5.90)
RDW: 13.6 % (ref 11.5–14.5)
WBC: 9.3 10*3/uL (ref 3.8–10.6)

## 2017-06-24 LAB — BASIC METABOLIC PANEL
Anion gap: 6 (ref 5–15)
BUN: 12 mg/dL (ref 6–20)
CO2: 26 mmol/L (ref 22–32)
CREATININE: 0.75 mg/dL (ref 0.61–1.24)
Calcium: 9.3 mg/dL (ref 8.9–10.3)
Chloride: 105 mmol/L (ref 101–111)
Glucose, Bld: 103 mg/dL — ABNORMAL HIGH (ref 65–99)
POTASSIUM: 3.6 mmol/L (ref 3.5–5.1)
SODIUM: 137 mmol/L (ref 135–145)

## 2017-06-24 LAB — TROPONIN I: Troponin I: <0.03 ng/mL (ref <0.03)

## 2017-06-24 NOTE — ED Triage Notes (Signed)
Pt reports he began having sensation that his heart was racing around 1620 today. Pt denies chest pain, states that he can "feel it beating". Pt reports hx of same multiple times and that he has been put on antianxiety meds. Pt reports feeling a "little" anxious when this occurred. NAD noted.

## 2017-06-24 NOTE — Discharge Instructions (Signed)
Your EKG, labs, and vital signs were unremarkable today. Keep your appointment for stress test in 2 days and follow up with your doctor as scheduled next week.

## 2017-06-24 NOTE — ED Provider Notes (Signed)
[facility] Emergency Department Provider Note  ____________________________________________  Time seen: Approximately 8:09 PM  I have reviewed the triage vital signs and the nursing notes.   HISTORY  Chief Complaint Palpitations    HPI Frank Obrien is a 37 y.o. male who complains of intermittently feeling like his heart is going fast. He reports that he currently "feels fine", but just when he puts his hand on his chest he feels like his heart is racing. He notices that when he gets up and walks around, his heart temporarily beats faster as well. He denies any exertional symptoms, denies dizziness or syncope with this. No chest pain shortness of breath. Symptoms are intermittent without specific aggravating or alleviating factors.  His primary care doctor has arranged for him to have a stress test in 2 days. He has an appointment again with his primary care doctor in 6 days.  This is his third ED visit in the last few weeks for the same symptoms. Workups have been negative in the past although he has been noted to have sinus tachycardia previously.     Past Medical History:  Diagnosis Date  . Anxiety   . Hypertension      There are no active problems to display for this patient.    Past Surgical History:  Procedure Laterality Date  . FRACTURE SURGERY       Prior to Admission medications   Medication Sig Start Date End Date Taking? Authorizing Provider  naproxen sodium (ALEVE) 220 MG tablet Take 220 mg by mouth 3 (three) times daily as needed.    [provider]   Citalopram  Allergies Patient has no known allergies.   No family history on file.  Social History Social History   Tobacco Use  . Smoking status: Light Tobacco Smoker  . Smokeless tobacco: Never Used  Substance Use Topics  . Alcohol use: Yes    Comment: 1-2 a week  . Drug use: No    Review of Systems  Constitutional:   No fever or chills.  ENT:    No sore throat. No rhinorrhea. Cardiovascular:   No chest pain or syncope. Positive palpitations Respiratory:   No dyspnea or cough. Gastrointestinal:   Negative for abdominal pain, vomiting and diarrhea.  Musculoskeletal:   Negative for focal pain or swelling All other systems reviewed and are negative except as documented above in ROS and HPI.  ____________________________________________   PHYSICAL EXAM:  VITAL SIGNS: ED Triage Vitals  Enc Vitals Group     BP 06/24/17 1837 (!) 183/109     Pulse Rate 06/24/17 1837 96     Resp 06/24/17 1837 18     Temp 06/24/17 1837 98.7 F (37.1 C)     Temp src --      SpO2 06/24/17 1837 98 %     Weight 06/24/17 1838 240 lb (108.9 kg)     Height 06/24/17 1838 5\' 7"  (1.702 m)     Head Circumference --      Peak Flow --      Pain Score --      Pain Loc --      Pain Edu? --      Excl. in GC? --     Vital signs reviewed, nursing assessments reviewed.   Constitutional:   Alert and oriented. Well appearing and in no distress. Eyes:   No scleral icterus.  EOMI. No nystagmus. No conjunctival pallor. PERRL. ENT   Head:   Normocephalic  and atraumatic.   Nose:   No congestion/rhinnorhea.    Mouth/Throat:   MMM, no pharyngeal erythema. No peritonsillar mass.    Neck:   No meningismus. Full ROM. Thyroid nonpalpable Hematological/Lymphatic/Immunilogical:   No cervical lymphadenopathy. Cardiovascular:   RRR. Symmetric bilateral radial and DP pulses.  No murmurs.  Respiratory:   Normal respiratory effort without tachypnea/retractions. Breath sounds are clear and equal bilaterally. No wheezes/rales/rhonchi. Gastrointestinal:   Soft and nontender. Non distended. There is no CVA tenderness.  No rebound, rigidity, or guarding. Genitourinary:   deferred Musculoskeletal:   Normal range of motion in all extremities. No joint effusions.  No lower extremity tenderness.  No edema. Neurologic:   Normal speech and language.  Motor grossly  intact. No acute focal neurologic deficits are appreciated.  Skin:    Skin is warm, dry and intact. No rash noted.  No petechiae, purpura, or bullae.  ____________________________________________    LABS (pertinent positives/negatives) (all labs ordered are listed, but only abnormal results are displayed) Labs Reviewed  BASIC METABOLIC PANEL - Abnormal; Notable for the following components:      Result Value   Glucose, Bld 103 (*)    All other components within normal limits  CBC  TROPONIN I   ____________________________________________   EKG  Interpreted by me Normal sinus rhythm rate of 96, rightward axis, normal intervals. Normal QRS ST segments and T waves. No evidence of underlying dysrhythmia.  ____________________________________________    RADIOLOGY  No results found.  ____________________________________________   PROCEDURES Procedures  ____________________________________________    CLINICAL IMPRESSION / ASSESSMENT AND PLAN / ED COURSE  Pertinent labs & imaging results that were available during my care of the patient were reviewed by me and considered in my medical decision making (see chart for details).   Patient well-appearing no acute distress, normal vital signs, currently asymptomatic. Exam is reassuring. EKG and labs are unremarkable. No suspicion for endocrine abnormalities such as hyperthyroidism or pheochromocytoma. No evidence of infection or sepsis. I doubt ACS PE dissection AAA or drug abuse. Counseled to continue taking his Celexa, follow-up with current outpatient workup.    ____________________________________________   FINAL CLINICAL IMPRESSION(S) / ED DIAGNOSES    Final diagnoses:  Palpitations  Anxiety     ED Discharge Orders    None      Portions of this note were generated with dragon dictation software. Dictation errors may occur despite best attempts at proofreading.    Sharman Cheek, MD 06/24/17 2012

## 2017-06-24 NOTE — ED Notes (Signed)
Discussed relaxation techniques with patient as he states the palpitations only happen when he has no work.

## 2020-01-31 ENCOUNTER — Other Ambulatory Visit: Payer: Self-pay | Admitting: Family Medicine

## 2020-01-31 DIAGNOSIS — R748 Abnormal levels of other serum enzymes: Secondary | ICD-10-CM

## 2020-02-07 ENCOUNTER — Other Ambulatory Visit: Payer: Self-pay

## 2020-02-07 ENCOUNTER — Ambulatory Visit
Admission: RE | Admit: 2020-02-07 | Discharge: 2020-02-07 | Disposition: A | Discharge status: Home / Self Care | Payer: Self-pay | Source: Ambulatory Visit | Attending: Family Medicine | Admitting: Family Medicine

## 2020-02-07 DIAGNOSIS — R748 Abnormal levels of other serum enzymes: Secondary | ICD-10-CM | POA: Insufficient documentation
